# Patient Record
Sex: Female | Born: 1945 | Race: White | Hispanic: No | Marital: Married | Smoking: Current every day smoker
Health system: Southern US, Community
[De-identification: ages and names within clinical notes are randomized; demographics above are authoritative.]

---

## 2016-05-03 ENCOUNTER — Ambulatory Visit (INDEPENDENT_AMBULATORY_CARE_PROVIDER_SITE_OTHER): Payer: Medicare (Managed Care)

## 2016-05-03 ENCOUNTER — Ambulatory Visit
Admission: EM | Admit: 2016-05-03 | Discharge: 2016-05-03 | Disposition: A | Discharge status: Home / Self Care | Payer: Medicare (Managed Care) | Attending: Emergency Medicine | Admitting: Emergency Medicine

## 2016-05-03 ENCOUNTER — Encounter: Payer: Self-pay | Admitting: *Deleted

## 2016-05-03 DIAGNOSIS — J069 Acute upper respiratory infection, unspecified: Secondary | ICD-10-CM

## 2016-05-03 DIAGNOSIS — J441 Chronic obstructive pulmonary disease with (acute) exacerbation: Secondary | ICD-10-CM | POA: Diagnosis not present

## 2016-05-03 LAB — BASIC METABOLIC PANEL
Anion gap: 8 (ref 5–15)
BUN: 14 mg/dL (ref 6–20)
CHLORIDE: 104 mmol/L (ref 101–111)
CO2: 24 mmol/L (ref 22–32)
CREATININE: 1 mg/dL (ref 0.44–1.00)
Calcium: 9.1 mg/dL (ref 8.9–10.3)
GFR calc Af Amer: >60 mL/min (ref >60)
GFR calc non Af Amer: 55 mL/min — ABNORMAL LOW (ref >60)
Glucose, Bld: 110 mg/dL — ABNORMAL HIGH (ref 65–99)
POTASSIUM: 3.9 mmol/L (ref 3.5–5.1)
Sodium: 136 mmol/L (ref 135–145)

## 2016-05-03 LAB — CBC WITH DIFFERENTIAL/PLATELET
Basophils Absolute: 0.1 10*3/uL (ref 0–0.1)
Basophils Relative: 1 %
Eosinophils Absolute: 0.2 10*3/uL (ref 0–0.7)
Eosinophils Relative: 3 %
HCT: 41.8 % (ref 35.0–47.0)
HEMOGLOBIN: 13.8 g/dL (ref 12.0–16.0)
LYMPHS ABS: 1.9 10*3/uL (ref 1.0–3.6)
LYMPHS PCT: 26 %
MCH: 31 pg (ref 26.0–34.0)
MCHC: 32.9 g/dL (ref 32.0–36.0)
MCV: 94.2 fL (ref 80.0–100.0)
MONOS PCT: 11 %
Monocytes Absolute: 0.8 10*3/uL (ref 0.2–0.9)
NEUTROS ABS: 4.3 10*3/uL (ref 1.4–6.5)
NEUTROS PCT: 59 %
Platelets: 258 10*3/uL (ref 150–440)
RBC: 4.44 MIL/uL (ref 3.80–5.20)
RDW: 13.9 % (ref 11.5–14.5)
WBC: 7.2 10*3/uL (ref 3.6–11.0)

## 2016-05-03 MED ORDER — AEROCHAMBER PLUS MISC: 2 refills | Status: AC

## 2016-05-03 MED ORDER — AMOXICILLIN-POT CLAVULANATE 875-125 MG PO TABS: 1.0000 | ORAL_TABLET | Freq: Two times a day (BID) | ORAL | 0 refills | Status: AC

## 2016-05-03 MED ORDER — ALBUTEROL SULFATE HFA 108 (90 BASE) MCG/ACT IN AERS: 1.0000 | INHALATION_SPRAY | Freq: Four times a day (QID) | RESPIRATORY_TRACT | 0 refills | Status: AC | PRN

## 2016-05-03 MED ORDER — PREDNISONE 20 MG PO TABS
40.0000 mg | ORAL_TABLET | Freq: Once | ORAL | Status: AC
  Administered 2016-05-03: 40 mg via ORAL

## 2016-05-03 MED ORDER — PREDNISONE 20 MG PO TABS: 40.0000 mg | ORAL_TABLET | Freq: Every day | ORAL | 0 refills | Status: AC

## 2016-05-03 MED ORDER — IPRATROPIUM-ALBUTEROL 0.5-2.5 (3) MG/3ML IN SOLN
3.0000 mL | Freq: Once | RESPIRATORY_TRACT | Status: AC
  Administered 2016-05-03: 3 mL via RESPIRATORY_TRACT

## 2016-05-03 NOTE — ED Provider Notes (Addendum)
HPI  SUBJECTIVE:  Angelica Jimenez is a 71 y.o. female who presents with nasal congestion, rhinorrhea, postnasal drip, feeling feverish, chest congestion and a cough occasionally productive of thick whitish sputum for 3 days. Patient states that she is coughing up more sputum than usual. She reports wheezing, shortness of breath and dyspnea on exertion and 50 feet. She tries: Medicine which have helped her symptoms. Symptoms are worse with walking around. She denies change in the color of her sputum, chest pain, unintentional weight gain, lower extremity edema, calf pain, orthopnea, PND, nocturia, abdominal pain, hemoptysis, prolonged immobilization, recent trauma, surgery in the past 4 weeks, she states that she did fly down from [place], but it was a 2 hour flight. No antibiotic use in the past month. Not on any steroids or inhaled bronchodilators for her COPD at home. Does not get frequent exacerbations. No history of cardiac disease. She is a smoker and has a history of moderate COPD recurrent pneumonias and depression. No history of DVT, PE, cancer, anticoagulant or antiplatelet use, diabetes, hypertension. PMD: In [place].    History reviewed. No pertinent past medical history.  History reviewed. No pertinent surgical history.  History reviewed. No pertinent family history.  Social History  Substance Use Topics  . Smoking status: Current Every Day Smoker  . Smokeless tobacco: Never Used  . Alcohol use No    No current facility-administered medications for this encounter.   Current Outpatient Prescriptions:  .  buPROPion (WELLBUTRIN SR) 100 MG 12 hr tablet, Take 100 mg by mouth 2 (two) times daily., Disp: , Rfl:  .  PARoxetine (PAXIL) 20 MG tablet, Take 20 mg by mouth daily., Disp: , Rfl:  .  albuterol (PROVENTIL HFA;VENTOLIN HFA) 108 (90 Base) MCG/ACT inhaler, Inhale 1-2 puffs into the lungs every 6 (six) hours as needed for wheezing or shortness of breath., Disp: 1 Inhaler, Rfl: 0 .   amoxicillin-clavulanate (AUGMENTIN) 875-125 MG tablet, Take 1 tablet by mouth 2 (two) times daily. X 7 days, Disp: 20 tablet, Rfl: 0 .  predniSONE (DELTASONE) 20 MG tablet, Take 2 tablets (40 mg total) by mouth daily with breakfast., Disp: 8 tablet, Rfl: 0 .  Spacer/Aero-Holding Chambers (AEROCHAMBER PLUS) inhaler, Use as instructed, Disp: 1 each, Rfl: 2  No Known Allergies   ROS  As noted in HPI.   Physical Exam  BP 132/90 (BP Location: Left Arm)   Pulse 78   Temp 98.3 F (36.8 C) (Oral)   Resp 16   Ht  (1.753 m)   Wt 180 lb (81.6 kg)   SpO2 99%   BMI 26.58 kg/m    Constitutional: Well developed, well nourished, no acute distress Eyes: PERRL, EOMI, conjunctiva normal bilaterally HENT: Normocephalic, atraumatic,mucus membranes moist. Mild nasal congestion. No sinus tenderness. No obvious postnasal drip. Respiratory: Prolonged expiratory phase, diffuse expiratory wheezing, rhonchi left lower lobe. Cardiovascular: Normal rate and rhythm, no murmurs, no gallops, no rubs GI: Nondistended skin: No rash, skin intact Musculoskeletal: Calves symmetric, equal size, No edema, no tenderness, no deformities Neurologic: Alert & oriented x 3, CN II-XII grossly intact, no motor deficits, sensation grossly intact Psychiatric: Speech and behavior appropriate   ED Course   Medications  ipratropium-albuterol (DUONEB) 0.5-2.5 (3) MG/3ML nebulizer solution 3 mL (3 mLs Nebulization Given 05/03/16 1132)  predniSONE (DELTASONE) tablet 40 mg (40 mg Oral Given 05/03/16 1156)    Orders Placed This Encounter  Procedures  . DG Chest 2 View    Standing Status:   Standing  Number of Occurrences:   1    Order Specific Question:   Reason for Exam (SYMPTOM  OR DIAGNOSIS REQUIRED)    Answer:   cough fever r/o PNA, effusion pulm edema  . CBC with Differential    Standing Status:   Standing    Number of Occurrences:   1  . Basic metabolic panel    Standing Status:   Standing    Number of  Occurrences:   1   Results for orders placed or performed during the hospital encounter of 05/03/16 (from the past 24 hour(s))  CBC with Differential     Status: None   Collection Time: 05/03/16 11:53 AM  Result Value Ref Range   WBC 7.2 3.6 - 11.0 K/uL   RBC 4.44 3.80 - 5.20 MIL/uL   Hemoglobin 13.8 12.0 - 16.0 g/dL   HCT 16.1 09.6 - 04.5 %   MCV 94.2 80.0 - 100.0 fL   MCH 31.0 26.0 - 34.0 pg   MCHC 32.9 32.0 - 36.0 g/dL   RDW 40.9 81.1 - 91.4 %   Platelets 258 150 - 440 K/uL   Neutrophils Relative % 59 %   Neutro Abs 4.3 1.4 - 6.5 K/uL   Lymphocytes Relative 26 %   Lymphs Abs 1.9 1.0 - 3.6 K/uL   Monocytes Relative 11 %   Monocytes Absolute 0.8 0.2 - 0.9 K/uL   Eosinophils Relative 3 %   Eosinophils Absolute 0.2 0 - 0.7 K/uL   Basophils Relative 1 %   Basophils Absolute 0.1 0 - 0.1 K/uL  Basic metabolic panel     Status: Abnormal   Collection Time: 05/03/16 11:53 AM  Result Value Ref Range   Sodium 136 135 - 145 mmol/L   Potassium 3.9 3.5 - 5.1 mmol/L   Chloride 104 101 - 111 mmol/L   CO2 24 22 - 32 mmol/L   Glucose, Bld 110 (H) 65 - 99 mg/dL   BUN 14 6 - 20 mg/dL   Creatinine, Ser 7.82 0.44 - 1.00 mg/dL   Calcium 9.1 8.9 - 95.6 mg/dL   GFR calc non Af Amer 55 (L) >60 mL/min   GFR calc Af Amer >60 >60 mL/min   Anion gap 8 5 - 15   Dg Chest 2 View  Result Date: 05/03/2016 CLINICAL DATA:  71 year old female with chest congestion for 4 days and nonproductive cough. Smoker. EXAM: CHEST  2 VIEW COMPARISON:  None. FINDINGS: Lung volumes at the upper limits of normal. Eventration of the diaphragm. Normal cardiac size and mediastinal contours. Visualized tracheal air column is within normal limits. 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 This includes a spiculated density measuring 8-9 mm. Mild diffuse increased pulmonary interstitial markings. No pneumothorax, pulmonary edema, pleural effusion or other confluent pulmonary opacity.  No acute osseous abnormality identified. Negative visible bowel gas pattern. IMPRESSION: 1. Left apical spiculated density with evidence of adjacent paraseptal emphysema is nonspecific but favored to be scarring. Comparison with any prior radiographs recommended, and if prior studies can be made available to me I will compare and addend this report. If there are no prior chest radiographs then recommend follow-up Chest CT to further characterize (noncontrast CT should suffice). 2. No acute infection or other acute cardiopulmonary abnormality identified. 3. These results will be called to the ordering clinician or representative by the Radiologist Assistant, and communication documented in the PACS or zVision Dashboard. Electronically Signed   By: Althea Grimmer.D.  On: 05/03/2016 12:02    ED Clinical Impression  COPD exacerbation (HCC)  Upper respiratory tract infection, unspecified type   ED Assessment/Plan  No previous O2 sats for comparison. Doubt PE. Modified well's criteria  0/9.  intital O2 sat 93%. Giving DuoNeb, 40 mg prednisone for COPD exacerbation. Checking chest x-ray to rule out pneumonia. We'll repeat O2 sat and reevaluate post treatment.  Post treatment, patient states she feels significantly better. Repeat O2 sat 99%. She still has moderate wheezing with prolonged expiratory phase. No rales or rhonchi.   BMP CBC normal. Left apical spiculated density with a evidence of adjacent paraseptal emphysema thought to be scarring. noncontrast Chest CT recommended to further characterize this. No other acute infection or cardiopulmonary abnormalities. See Radiology report for details.  This could be classified as a complicated COPD exacerbation from a URI due to her age. Plan to send home with albuterol and spacer, Augmentin for 10 days, 40 mg prednisone for 4 more days, Mucinex. She will follow-up with her primary care physician as needed, go to the ER if she gets worse.  Discussed imaging  result with patient. 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  Discussed labs, imaging, MDM, plan and followup with patient. Discussed sn/sx that should prompt return to the ED. Patient agrees with plan.   Meds ordered this encounter  Medications  . PARoxetine (PAXIL) 20 MG tablet    Sig: Take 20 mg by mouth daily.  Marland Kitchen buPROPion (WELLBUTRIN SR) 100 MG 12 hr tablet    Sig: Take 100 mg by mouth 2 (two) times daily.  Marland Kitchen ipratropium-albuterol (DUONEB) 0.5-2.5 (3) MG/3ML nebulizer solution 3 mL  . predniSONE (DELTASONE) tablet 40 mg  . Spacer/Aero-Holding Chambers (AEROCHAMBER PLUS) inhaler    Sig: Use as instructed    Dispense:  1 each    Refill:  2  . albuterol (PROVENTIL HFA;VENTOLIN HFA) 108 (90 Base) MCG/ACT inhaler    Sig: Inhale 1-2 puffs into the lungs every 6 (six) hours as needed for wheezing or shortness of breath.    Dispense:  1 Inhaler    Refill:  0  . amoxicillin-clavulanate (AUGMENTIN) 875-125 MG tablet    Sig: Take 1 tablet by mouth 2 (two) times daily. X 7 days    Dispense:  20 tablet    Refill:  0  . predniSONE (DELTASONE) 20 MG tablet    Sig: Take 2 tablets (40 mg total) by mouth daily with breakfast.    Dispense:  8 tablet    Refill:  0    *This clinic note was created using Scientist, clinical (histocompatibility and immunogenetics). Therefore, there may be occasional mistakes despite careful proofreading.  ?   Domenick Gong, MD 05/03/16 1245    Domenick Gong, MD 05/03/16 1250

## 2016-05-03 NOTE — ED Triage Notes (Signed)
Productive cough, fever, dyspnea, head and chest congestion x2-3 days.

## 2016-05-03 NOTE — Discharge Instructions (Signed)
2 puffs from your albuterol inhaler every 4-6 hours as needed for coughing, wheezing, shortness of breath. Start the prednisone tomorrow. You have already taken today's dose. Start some Mucinex to help loosen the mucus so that you can cough it out. Finish the Augmentin even if he feels better unless your doctor tells you to stop.   You had an abnormality on your x-ray, a 8-9 mm spiculated density, which they think could be scarring, however, radiology has recommended that you have a noncontrast chest CT to further characterize this. This can be done with your primary care physician when you get home.

## 2017-09-02 IMAGING — CR DG CHEST 2V
2 series · 3 of 3 positions shown · non-contrast
Comparison: None.

CLINICAL DATA: 70-year-old female with chest congestion for 4 days
and nonproductive cough. Smoker.

EXAM:
CHEST  2 VIEW

[chest pa]
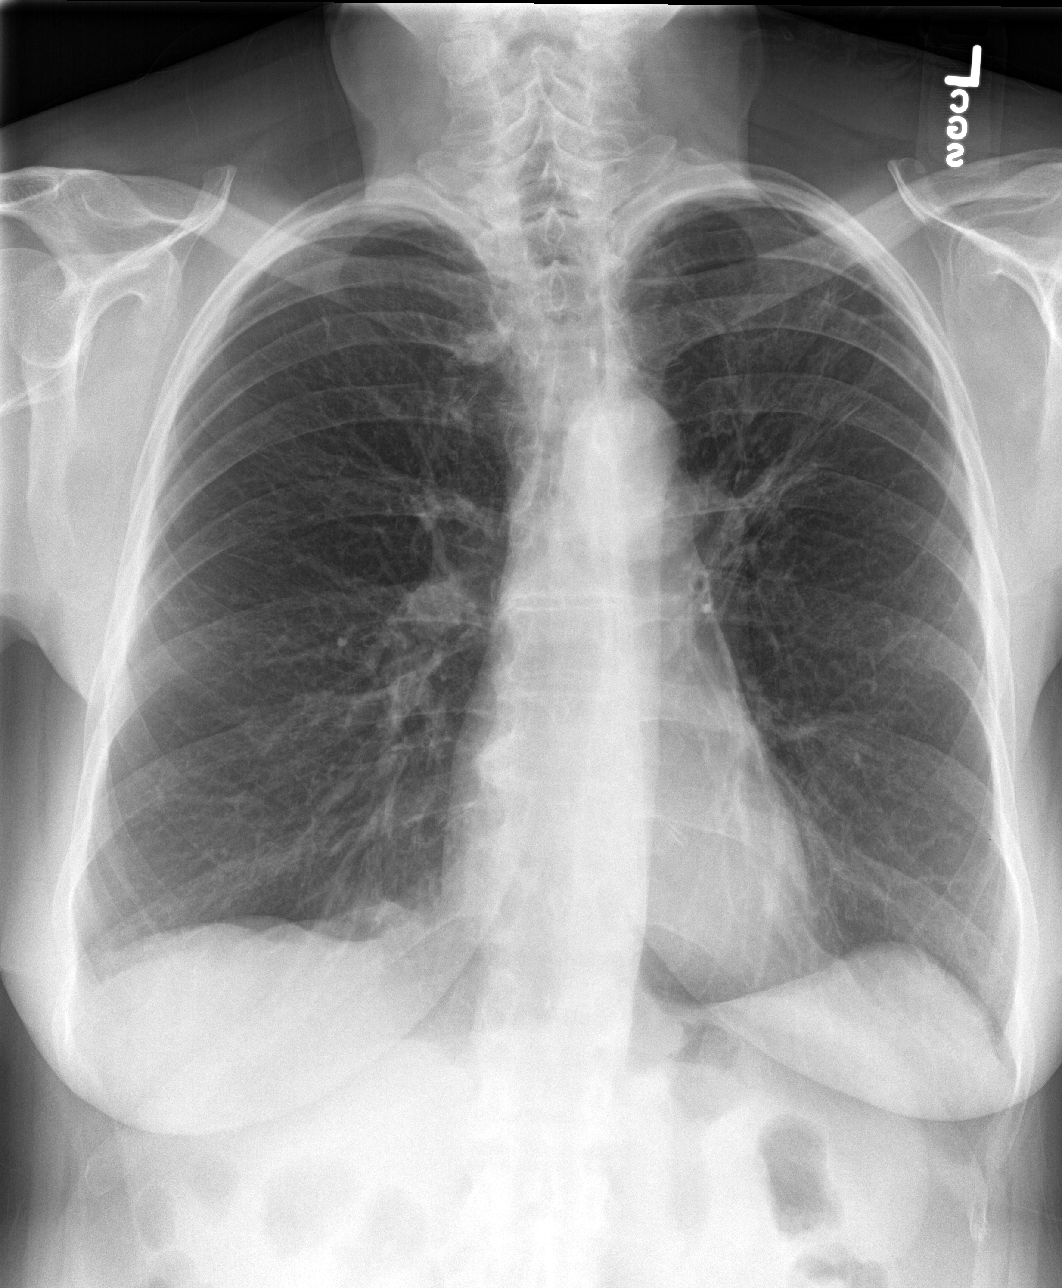

[Series 2: chest lat · 0.14mm/px · 2 of 2 slices shown]
[im 1/2]
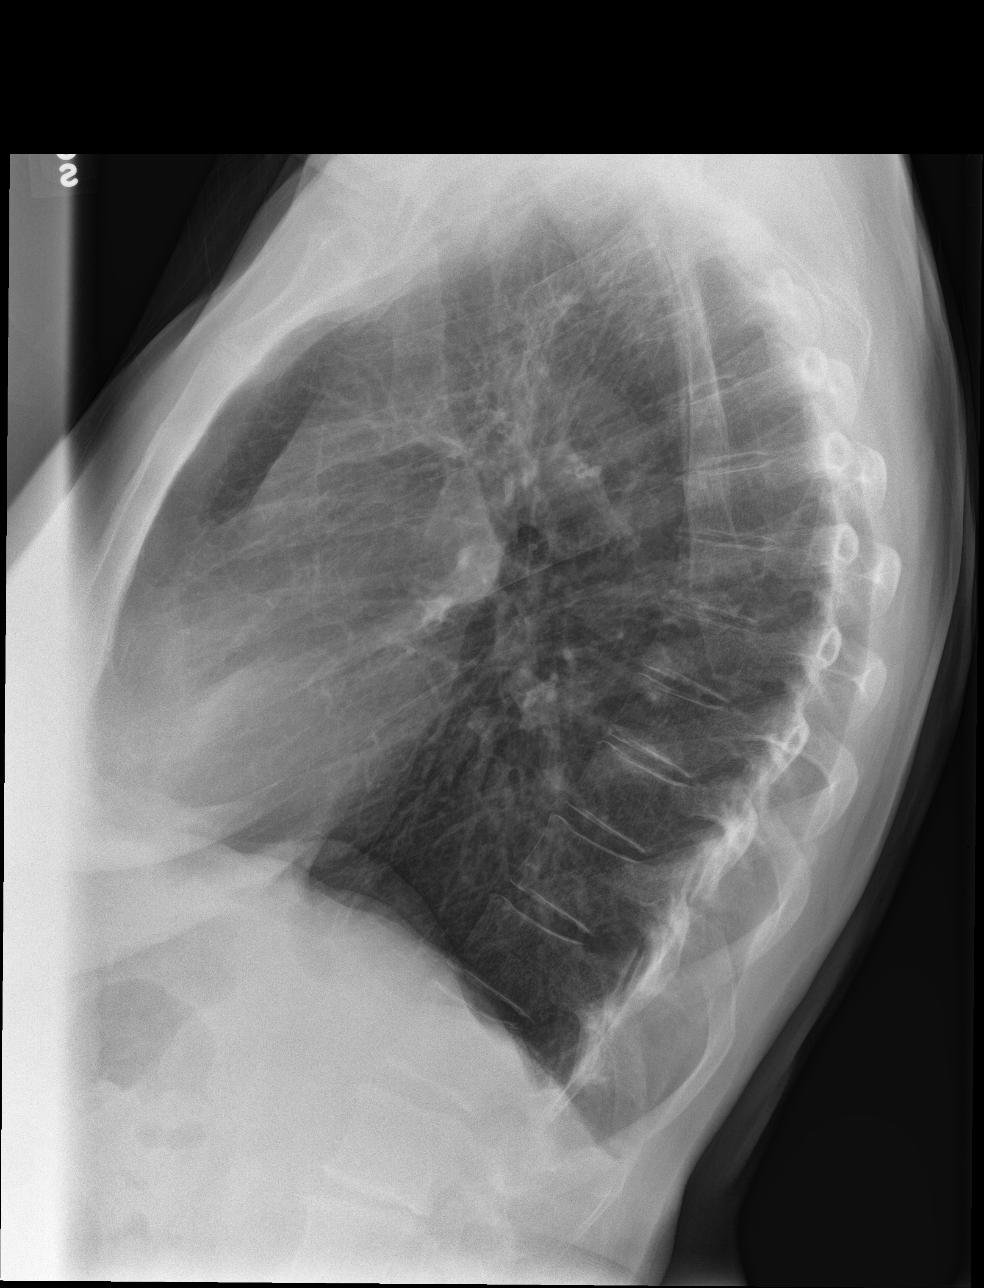
[im 2/2]
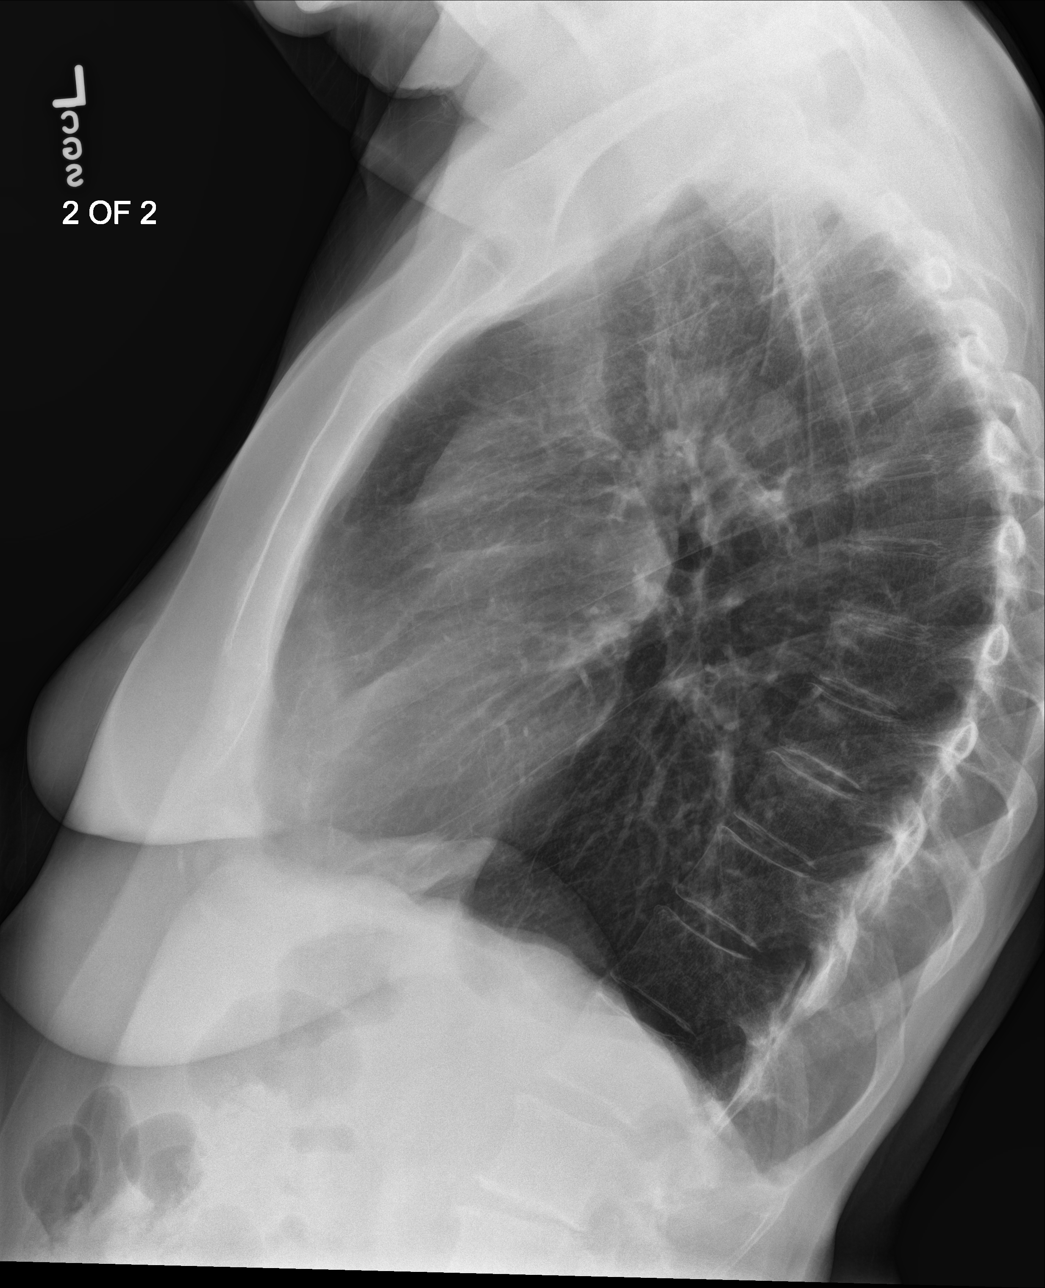

[3 of 3 positions shown; findings below may reference images not displayed]

FINDINGS: Lung volumes at the upper limits of normal. Eventration of the
diaphragm. Normal cardiac size and mediastinal contours. Visualized
tracheal air column is within normal limits. Evidence of paraseptal
emphysema and stellate scarring in the peripheral left upper lobe.
This includes a spiculated density measuring 8-9 mm. Mild diffuse
increased pulmonary interstitial markings. No pneumothorax,
pulmonary edema, pleural effusion or other confluent pulmonary
opacity. No acute osseous abnormality identified. Negative visible
bowel gas pattern.
IMPRESSION: 1. Left apical spiculated density with evidence of adjacent
paraseptal emphysema is nonspecific but favored to be scarring.
Comparison with any prior radiographs recommended, and if prior
studies can be made available to me I will compare and addend this
report.
If there are no prior chest radiographs then recommend follow-up
Chest CT to further characterize (noncontrast CT should suffice).
2. No acute infection or other acute cardiopulmonary abnormality
identified.
3. These results will be called to the ordering clinician or
representative by the Radiologist Assistant, and communication
documented in the PACS or zVision Dashboard.
